# Patient Record
Sex: Male | Born: 1980 | Race: White | Hispanic: No | Smoking: Never smoker
Health system: Southern US, Community
[De-identification: ages and names within clinical notes are randomized; demographics above are authoritative.]

---

## 2019-11-22 ENCOUNTER — Emergency Department (HOSPITAL_BASED_OUTPATIENT_CLINIC_OR_DEPARTMENT_OTHER): Payer: Worker's Compensation

## 2019-11-22 ENCOUNTER — Other Ambulatory Visit: Payer: Self-pay

## 2019-11-22 ENCOUNTER — Encounter (HOSPITAL_BASED_OUTPATIENT_CLINIC_OR_DEPARTMENT_OTHER): Payer: Self-pay | Admitting: *Deleted

## 2019-11-22 DIAGNOSIS — S51812A Laceration without foreign body of left forearm, initial encounter: Secondary | ICD-10-CM | POA: Diagnosis present

## 2019-11-22 DIAGNOSIS — Y9389 Activity, other specified: Secondary | ICD-10-CM | POA: Diagnosis not present

## 2019-11-22 DIAGNOSIS — Y9289 Other specified places as the place of occurrence of the external cause: Secondary | ICD-10-CM | POA: Diagnosis not present

## 2019-11-22 DIAGNOSIS — Z23 Encounter for immunization: Secondary | ICD-10-CM | POA: Diagnosis not present

## 2019-11-22 DIAGNOSIS — Y998 Other external cause status: Secondary | ICD-10-CM | POA: Diagnosis not present

## 2019-11-22 DIAGNOSIS — W268XXA Contact with other sharp object(s), not elsewhere classified, initial encounter: Secondary | ICD-10-CM | POA: Insufficient documentation

## 2019-11-22 NOTE — ED Triage Notes (Signed)
Pt c/o lac to left fore arm by metal x 30 mins ago while at work

## 2019-11-22 NOTE — ED Notes (Signed)
NO drug screen per [employer]

## 2019-11-23 ENCOUNTER — Emergency Department (HOSPITAL_BASED_OUTPATIENT_CLINIC_OR_DEPARTMENT_OTHER)
Admission: EM | Admit: 2019-11-23 | Discharge: 2019-11-23 | Disposition: A | Discharge status: Home / Self Care | Payer: Worker's Compensation | Attending: Emergency Medicine | Admitting: Emergency Medicine

## 2019-11-23 DIAGNOSIS — S51812A Laceration without foreign body of left forearm, initial encounter: Secondary | ICD-10-CM

## 2019-11-23 MED ORDER — LIDOCAINE-EPINEPHRINE (PF) 2 %-1:200000 IJ SOLN
20.0000 mL | Freq: Once | INTRAMUSCULAR | Status: AC
  Administered 2019-11-23: 20 mL
  Filled 2019-11-23: qty 20

## 2019-11-23 MED ORDER — TETANUS-DIPHTH-ACELL PERTUSSIS 5-2.5-18.5 LF-MCG/0.5 IM SUSP
0.5000 mL | Freq: Once | INTRAMUSCULAR | Status: AC
  Administered 2019-11-23: 0.5 mL via INTRAMUSCULAR
  Filled 2019-11-23: qty 0.5

## 2019-11-23 NOTE — ED Provider Notes (Signed)
[facility] EMERGENCY DEPARTMENT Provider Note   CSN: [id number] Arrival date & time: 11/22/19  2126     History Chief Complaint  Patient presents with  . Laceration    Kristopher Freeman is a 39 y.o. male.  Patient presents to the emergency department with a chief complaint of laceration.  He states that he was cut by a piece of metal tonight.  The laceration occurred prior to arrival.  He denies any numbness, weakness, or tingling into his left hand.  Denies any other associated symptoms.  The history is provided by the patient. No language interpreter was used.       History reviewed. No pertinent past medical history.  There are no problems to display for this patient.   History reviewed. No pertinent surgical history.     No family history on file.  Social History   Tobacco Use  . Smoking status: Never Smoker  Substance Use Topics  . Alcohol use: Not Currently  . Drug use: Not Currently    Home Medications Prior to Admission medications   Not on File    Allergies    Patient has no known allergies.  Review of Systems   Review of Systems  All other systems reviewed and are negative.   Physical Exam Updated Vital Signs BP (!) 132/96 (BP Location: Left Arm)   Pulse 64   Temp 98.2 F (36.8 C) (Oral)   Resp 16   Ht 6\' 5"  (1.956 m)   Wt (!) 155.1 kg   SpO2 100%   BMI 40.56 kg/m   Physical Exam Vitals and nursing note reviewed.  Constitutional:      General: He is not in acute distress.    Appearance: He is well-developed. He is not ill-appearing.  HENT:     Head: Normocephalic and atraumatic.  Eyes:     Conjunctiva/sclera: Conjunctivae normal.  Cardiovascular:     Rate and Rhythm: Normal rate.  Pulmonary:     Effort: Pulmonary effort is normal. No respiratory distress.  Abdominal:     General: There is no distension.  Musculoskeletal:     Cervical back: Neck supple.     Comments: Range of motion and strength of left hand and  fingers is 5/5  Skin:    General: Skin is warm and dry.     Comments: 3 cm shallow laceration to the left forearm, no foreign body, no tendon involvement  Neurological:     Mental Status: He is alert and oriented to person, place, and time.  Psychiatric:        Mood and Affect: Mood normal.        Behavior: Behavior normal.     ED Results / Procedures / Treatments   Labs (all labs ordered are listed, but only abnormal results are displayed) Labs Reviewed - No data to display  EKG None  Radiology DG Wrist Complete Left  Result Date: 11/22/2019 CLINICAL DATA:  Laceration to distal forearm EXAM: LEFT WRIST - COMPLETE 3+ VIEW COMPARISON:  None. FINDINGS: There is no evidence of fracture or dislocation. There is no evidence of arthropathy or other focal bone abnormality. Soft tissues are unremarkable. IMPRESSION: Negative. Electronically Signed   By: 11/24/2019 M.D.   On: 11/22/2019 21:46    Procedures .09/02/2021Laceration Repair  Date/Time: 11/23/2019 3:56 AM Performed by: 01/23/2020, PA-C Authorized by: Roxy Horseman, PA-C   Consent:    Consent obtained:  Verbal   Consent given by:  Patient  Risks discussed:  Infection, need for additional repair, pain, poor cosmetic result and poor wound healing   Alternatives discussed:  No treatment and delayed treatment Universal protocol:    Procedure explained and questions answered to patient or proxy's satisfaction: yes     Relevant documents present and verified: yes     Test results available and properly labeled: yes     Imaging studies available: yes     Required blood products, implants, devices, and special equipment available: yes     Site/side marked: yes     Immediately prior to procedure, a time out was called: yes     Patient identity confirmed:  Verbally with patient Anesthesia (see MAR for exact dosages):    Anesthesia method:  Local infiltration   Local anesthetic:  Lidocaine 1% WITH epi Laceration details:     Length (cm):  3 Repair type:    Repair type:  Simple Pre-procedure details:    Preparation:  Patient was prepped and draped in usual sterile fashion Exploration:    Hemostasis achieved with:  Direct pressure   Wound exploration: wound explored through full range of motion and entire depth of wound probed and visualized     Wound extent: no fascia violation noted, no foreign bodies/material noted, no muscle damage noted, no nerve damage noted, no tendon damage noted, no underlying fracture noted and no vascular damage noted     Contaminated: no   Treatment:    Area cleansed with:  Saline   Amount of cleaning:  Standard   Visualized foreign bodies/material removed: no   Skin repair:    Repair method:  Sutures   Suture size:  4-0   Suture material:  Prolene   Number of sutures:  6 Approximation:    Approximation:  Close Post-procedure details:    Dressing:  Open (no dressing)   Patient tolerance of procedure:  Tolerated well, no immediate complications   (including critical care time)  Medications Ordered in ED Medications  Tdap (BOOSTRIX) injection 0.5 mL (has no administration in time range)  lidocaine-EPINEPHrine (XYLOCAINE W/EPI) 2 %-1:200000 (PF) injection 20 mL (has no administration in time range)    ED Course  I have reviewed the triage vital signs and the nursing notes.  Pertinent labs & imaging results that were available during my care of the patient were reviewed by me and considered in my medical decision making (see chart for details).    MDM Rules/Calculators/A&P                          Patient with shallow laceration to the left anterior wrist.  Repaired by me.  Plain films are negative.  Tetanus shot updated. Final Clinical Impression(s) / ED Diagnoses Final diagnoses:  Laceration of left forearm, initial encounter    Rx / DC Orders ED Discharge Orders    None       Roxy Horseman, PA-C 11/23/19 0358    Palumbo, April, MD 11/23/19 0405

## 2021-09-19 IMAGING — CR DG WRIST COMPLETE 3+V*L*
4 series · 4 of 4 positions shown · non-contrast
Comparison: None.

CLINICAL DATA: Laceration to distal forearm

EXAM:
LEFT WRIST - COMPLETE 3+ VIEW

[x wrist pa left]
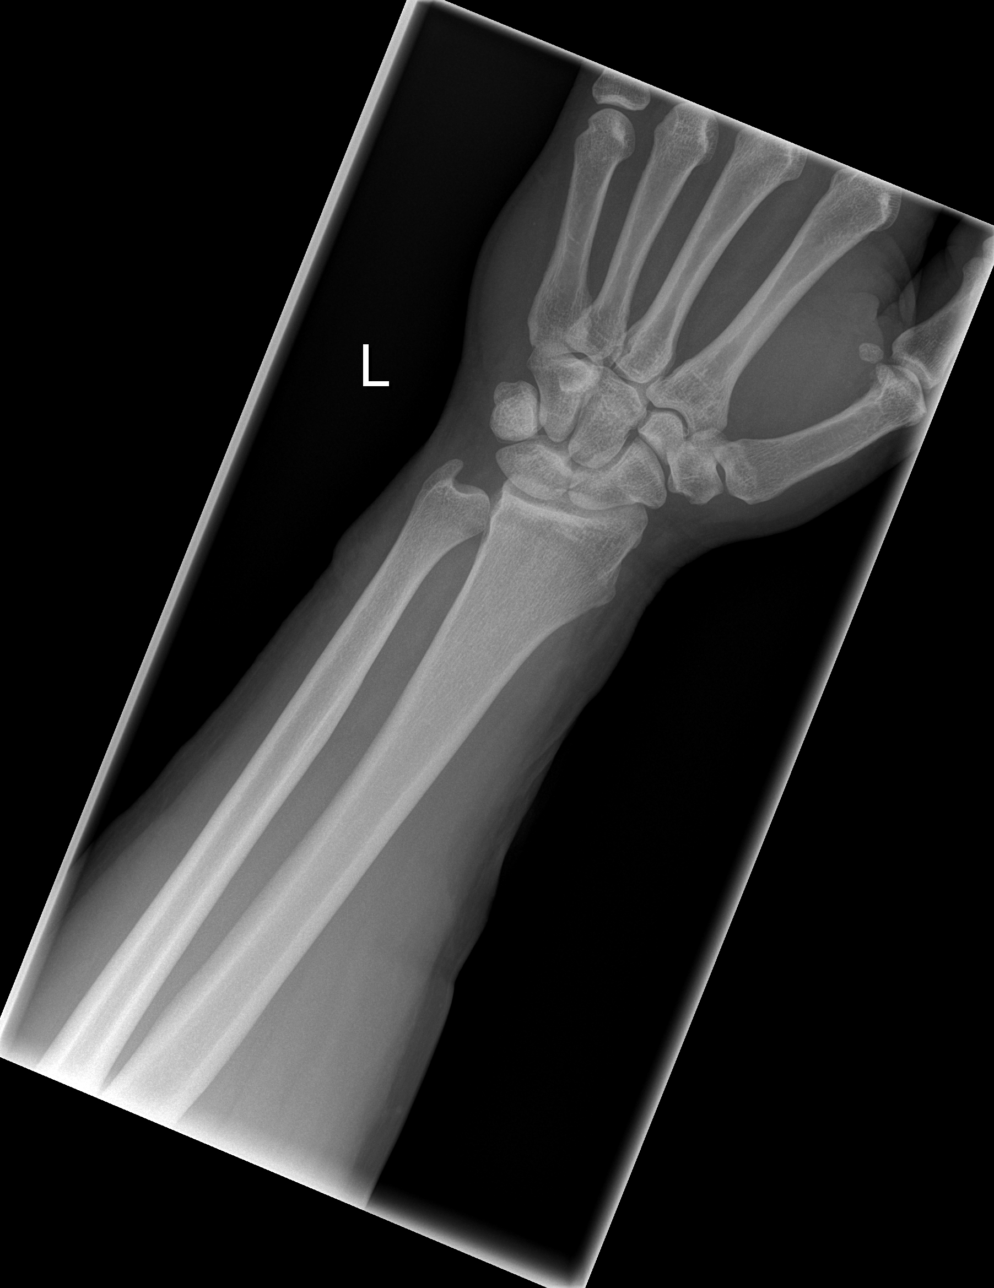

[x wrist obl left (1 of 2)]
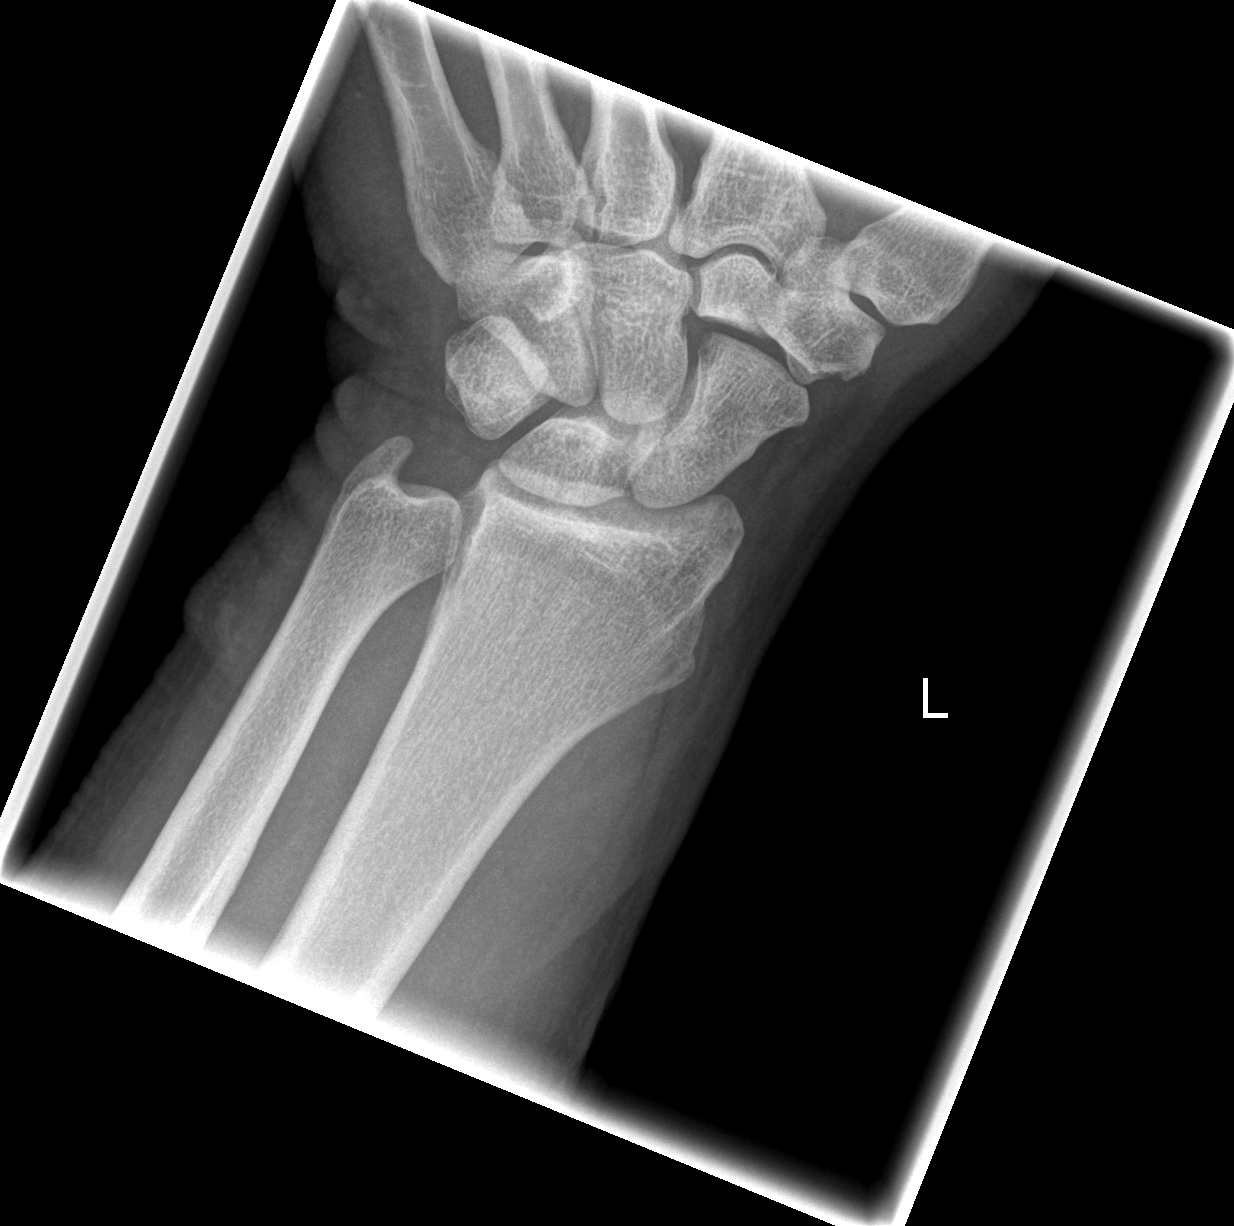

[x wrist obl left (2 of 2)]
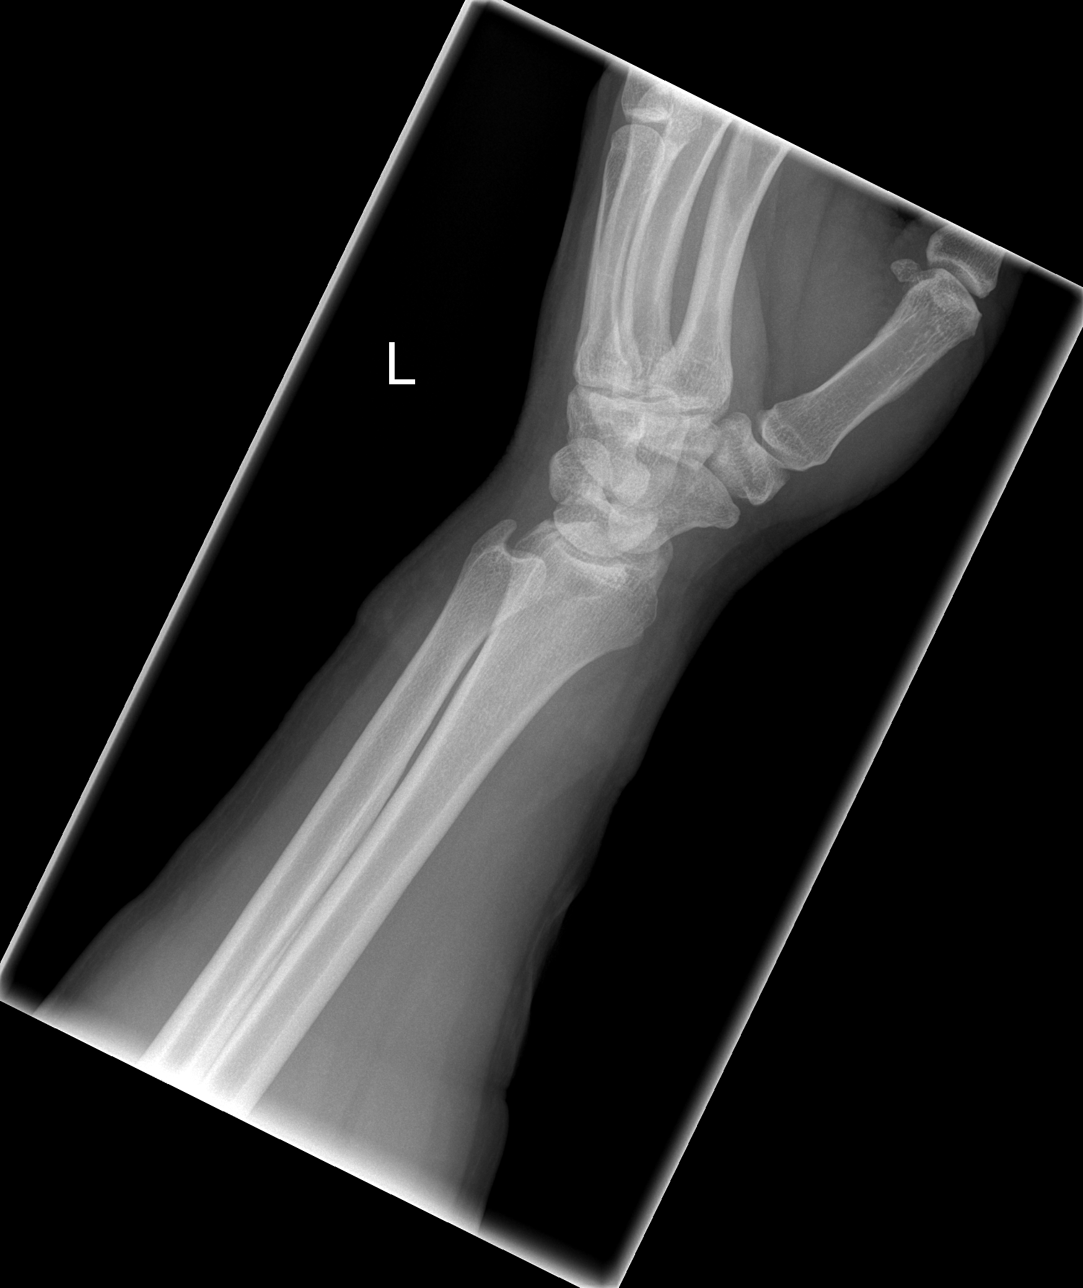

[x wrist lat left]
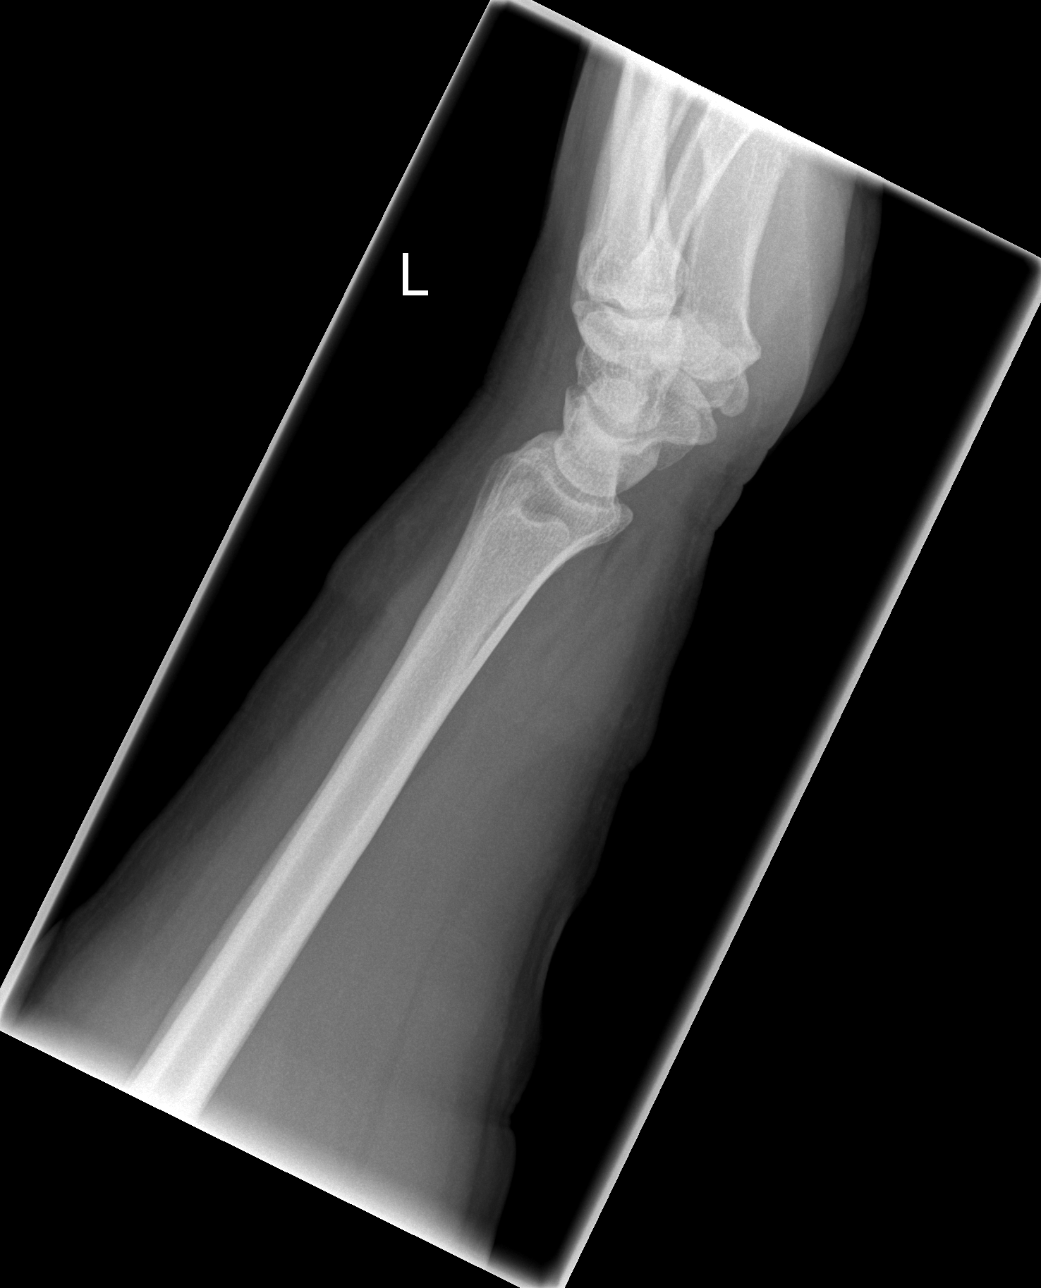

[4 of 4 positions shown; findings below may reference images not displayed]

FINDINGS: There is no evidence of fracture or dislocation. There is no
evidence of arthropathy or other focal bone abnormality. Soft
tissues are unremarkable.
IMPRESSION: Negative.
